# Patient Record
Sex: Male | Born: 1945 | Race: Black or African American | Hispanic: No | Marital: Married | Smoking: Current every day smoker
Health system: Southern US, Community
[De-identification: ages and names within clinical notes are randomized; demographics above are authoritative.]

## PROBLEM LIST (undated history)

## (undated) DIAGNOSIS — M810 Age-related osteoporosis without current pathological fracture: Secondary | ICD-10-CM

## (undated) DIAGNOSIS — M549 Dorsalgia, unspecified: Secondary | ICD-10-CM

## (undated) DIAGNOSIS — I10 Essential (primary) hypertension: Secondary | ICD-10-CM

## (undated) DIAGNOSIS — C9000 Multiple myeloma not having achieved remission: Secondary | ICD-10-CM

## (undated) DIAGNOSIS — D472 Monoclonal gammopathy: Secondary | ICD-10-CM

## (undated) DIAGNOSIS — E119 Type 2 diabetes mellitus without complications: Secondary | ICD-10-CM

---

## 2004-12-05 ENCOUNTER — Ambulatory Visit: Payer: Self-pay | Admitting: Gastroenterology

## 2005-01-04 ENCOUNTER — Ambulatory Visit: Payer: Self-pay | Admitting: Internal Medicine

## 2005-01-11 ENCOUNTER — Ambulatory Visit: Payer: Self-pay | Admitting: Internal Medicine

## 2005-02-22 ENCOUNTER — Ambulatory Visit: Payer: Self-pay | Admitting: Internal Medicine

## 2005-03-13 ENCOUNTER — Ambulatory Visit: Payer: Self-pay | Admitting: Internal Medicine

## 2005-04-13 ENCOUNTER — Ambulatory Visit: Payer: Self-pay | Admitting: Internal Medicine

## 2005-05-23 ENCOUNTER — Ambulatory Visit: Payer: Self-pay | Admitting: Internal Medicine

## 2005-06-13 ENCOUNTER — Ambulatory Visit: Payer: Self-pay | Admitting: Internal Medicine

## 2005-08-22 ENCOUNTER — Ambulatory Visit: Payer: Self-pay | Admitting: Internal Medicine

## 2005-09-13 ENCOUNTER — Ambulatory Visit: Payer: Self-pay | Admitting: Internal Medicine

## 2014-10-04 ENCOUNTER — Emergency Department: Payer: Self-pay | Admitting: Emergency Medicine

## 2016-05-21 IMAGING — CR DG THORACIC SPINE 2-3V
1 series · 3 of 3 positions shown · non-contrast
Comparison: None.

CLINICAL DATA: Fall with upper back pain and rib pain.

EXAM:
THORACIC SPINE - 2 VIEW

[Series 1: dxr thoracic  ap and lateral · 0.14mm/px · 3 of 3 slices shown]
[im 1/3]
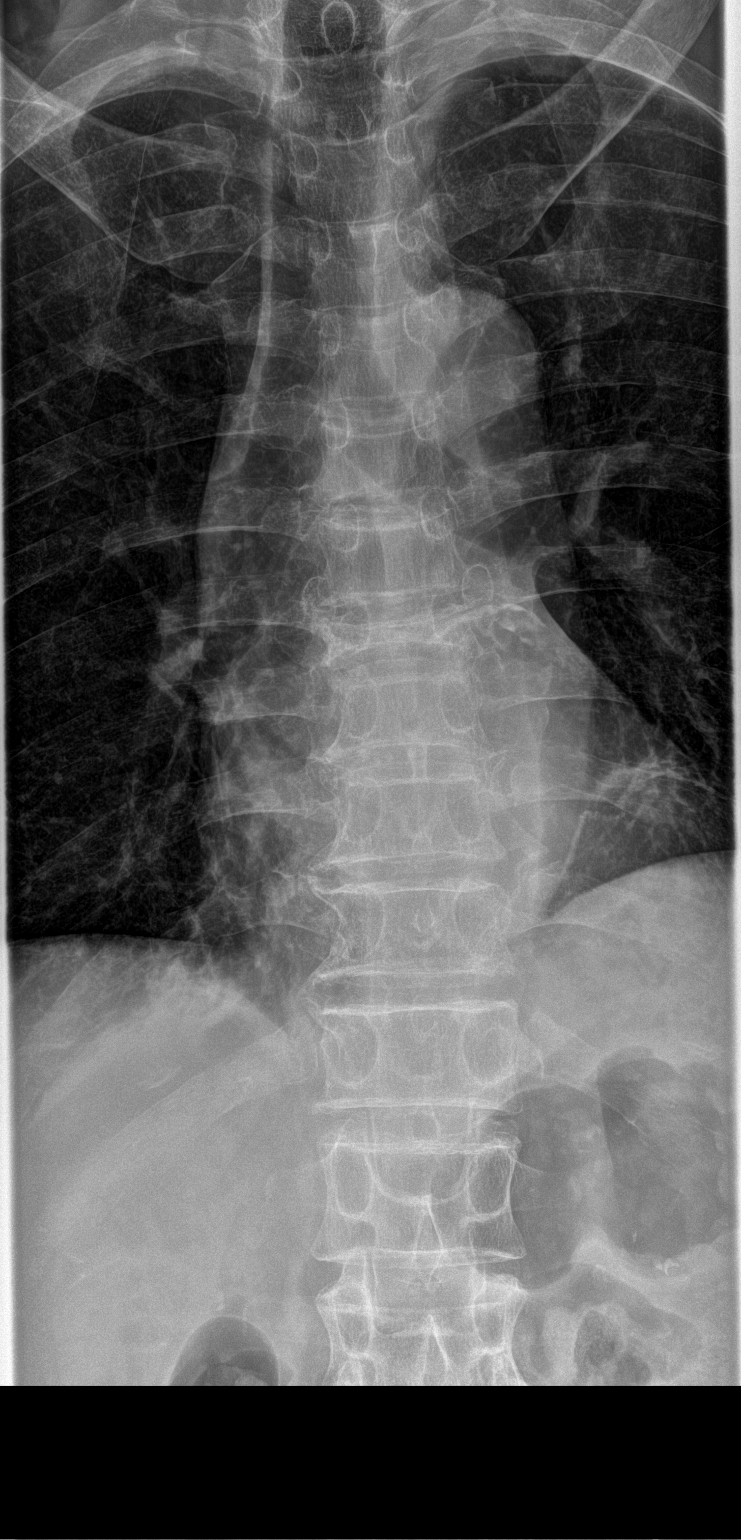
[im 2/3]
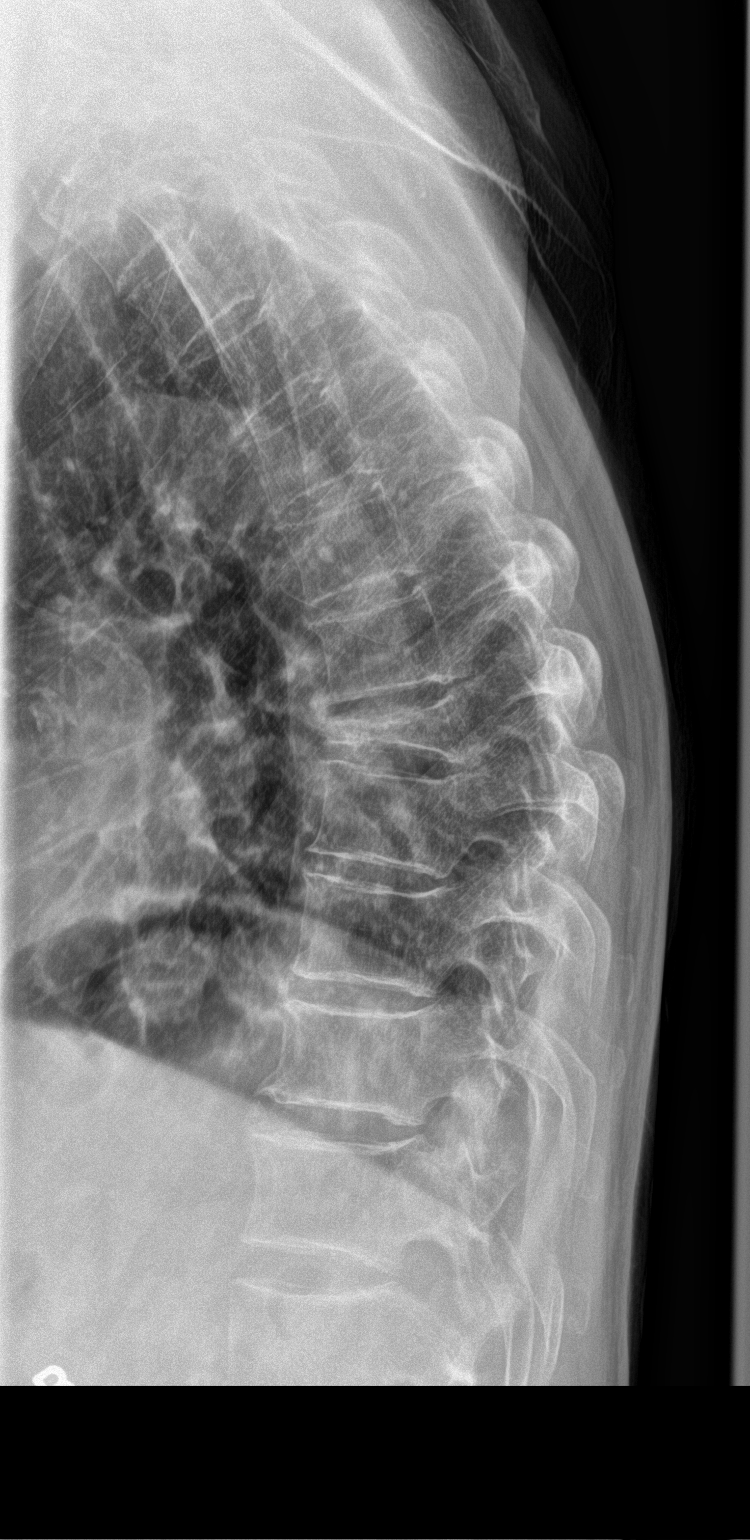
[im 3/3]
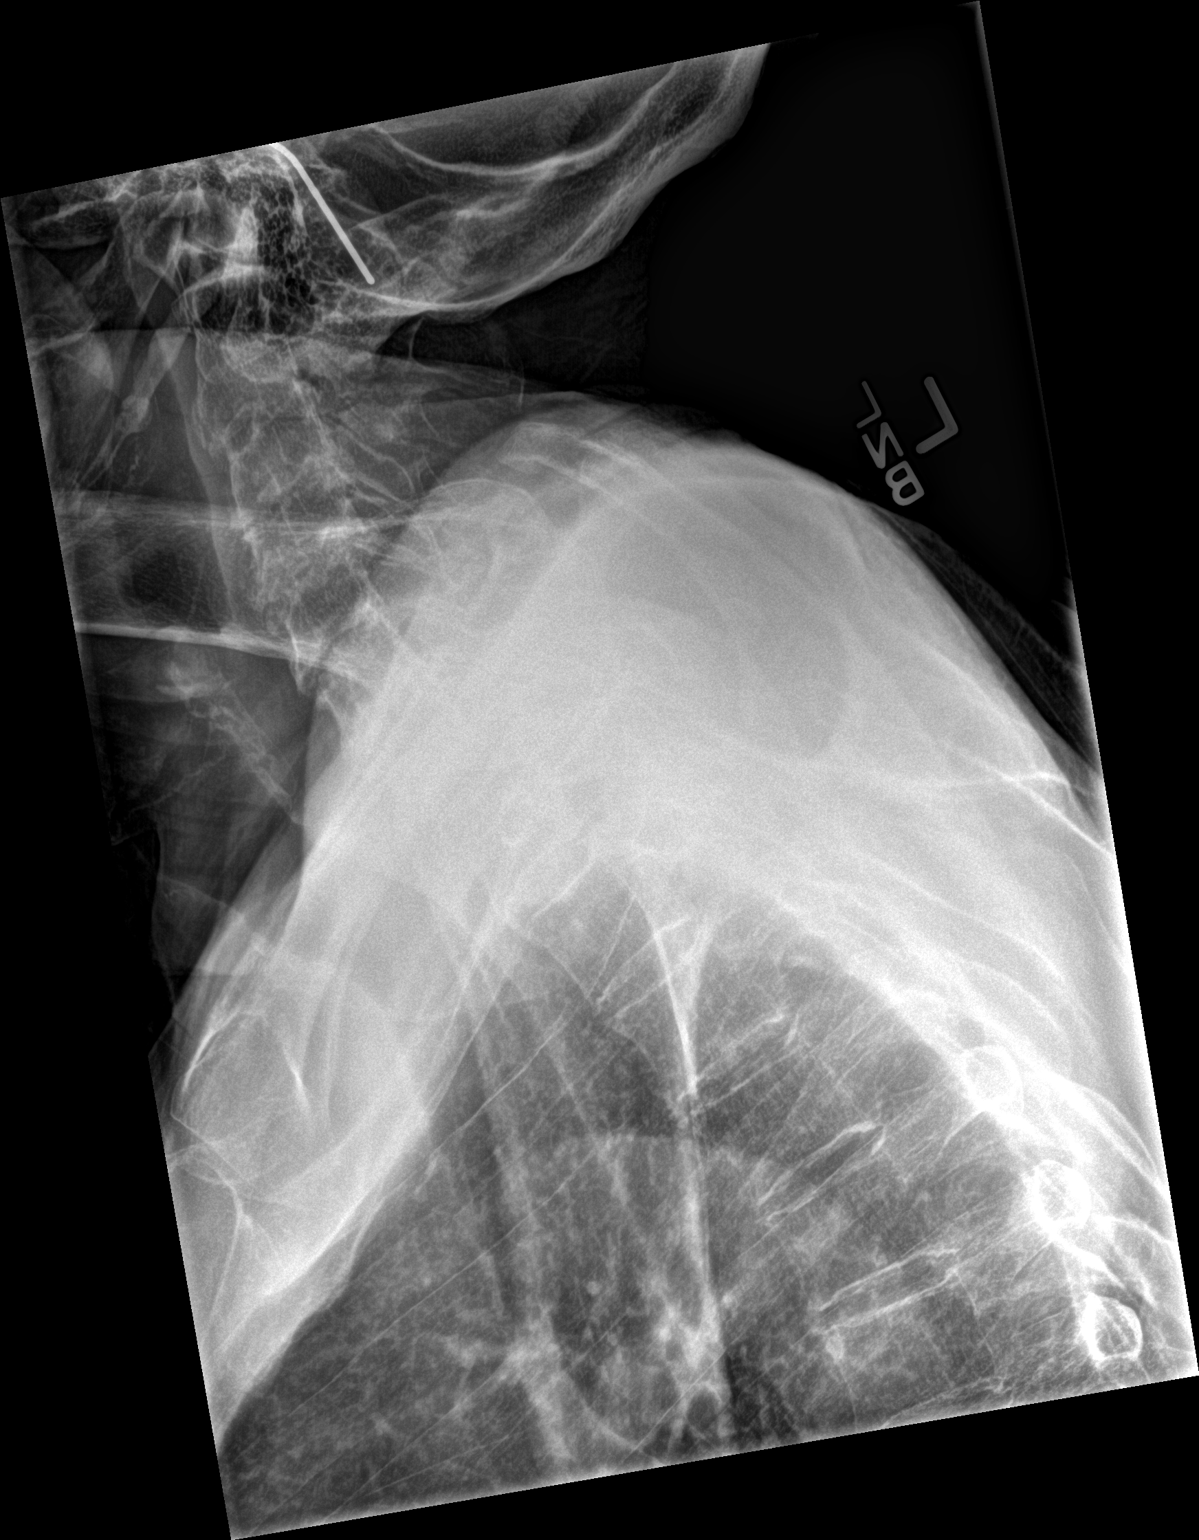

[3 of 3 positions shown; findings below may reference images not displayed]

FINDINGS: Severe compression fracture noted in the mid thoracic spine, likely
T8. This is age indeterminate, favor chronic. Normal alignment. No
additional fracture.
IMPRESSION: Severe T8 compression fracture, age indeterminate but favor chronic.

## 2016-05-29 ENCOUNTER — Encounter: Payer: Self-pay | Admitting: Emergency Medicine

## 2016-05-29 ENCOUNTER — Emergency Department
Admission: EM | Admit: 2016-05-29 | Discharge: 2016-05-29 | Disposition: A | Discharge status: Home / Self Care | Payer: PPO | Attending: Emergency Medicine | Admitting: Emergency Medicine

## 2016-05-29 DIAGNOSIS — I129 Hypertensive chronic kidney disease with stage 1 through stage 4 chronic kidney disease, or unspecified chronic kidney disease: Secondary | ICD-10-CM | POA: Insufficient documentation

## 2016-05-29 DIAGNOSIS — F10920 Alcohol use, unspecified with intoxication, uncomplicated: Secondary | ICD-10-CM

## 2016-05-29 DIAGNOSIS — M810 Age-related osteoporosis without current pathological fracture: Secondary | ICD-10-CM | POA: Insufficient documentation

## 2016-05-29 DIAGNOSIS — F17210 Nicotine dependence, cigarettes, uncomplicated: Secondary | ICD-10-CM | POA: Insufficient documentation

## 2016-05-29 DIAGNOSIS — E1122 Type 2 diabetes mellitus with diabetic chronic kidney disease: Secondary | ICD-10-CM | POA: Diagnosis not present

## 2016-05-29 DIAGNOSIS — N189 Chronic kidney disease, unspecified: Secondary | ICD-10-CM | POA: Diagnosis not present

## 2016-05-29 DIAGNOSIS — F10129 Alcohol abuse with intoxication, unspecified: Secondary | ICD-10-CM | POA: Diagnosis present

## 2016-05-29 HISTORY — DX: Monoclonal gammopathy: D47.2

## 2016-05-29 HISTORY — DX: Type 2 diabetes mellitus without complications: E11.9

## 2016-05-29 HISTORY — DX: Multiple myeloma not having achieved remission: C90.00

## 2016-05-29 HISTORY — DX: Essential (primary) hypertension: I10

## 2016-05-29 HISTORY — DX: Age-related osteoporosis without current pathological fracture: M81.0

## 2016-05-29 HISTORY — DX: Dorsalgia, unspecified: M54.9

## 2016-05-29 LAB — CBC WITH DIFFERENTIAL/PLATELET
BASOS PCT: 2 %
Basophils Absolute: 0.2 10*3/uL — ABNORMAL HIGH (ref 0–0.1)
EOS ABS: 0 10*3/uL (ref 0–0.7)
EOS PCT: 0 %
HCT: 29.4 % — ABNORMAL LOW (ref 40.0–52.0)
HEMOGLOBIN: 10.2 g/dL — AB (ref 13.0–18.0)
LYMPHS ABS: 2 10*3/uL (ref 1.0–3.6)
Lymphocytes Relative: 21 %
MCH: 35.4 pg — AB (ref 26.0–34.0)
MCHC: 34.6 g/dL (ref 32.0–36.0)
MCV: 102.2 fL — ABNORMAL HIGH (ref 80.0–100.0)
MONO ABS: 0.8 10*3/uL (ref 0.2–1.0)
MONOS PCT: 9 %
NEUTROS PCT: 68 %
Neutro Abs: 6.3 10*3/uL (ref 1.4–6.5)
PLATELETS: 211 10*3/uL (ref 150–440)
RBC: 2.87 MIL/uL — ABNORMAL LOW (ref 4.40–5.90)
RDW: 13.8 % (ref 11.5–14.5)
WBC: 9.3 10*3/uL (ref 3.8–10.6)

## 2016-05-29 LAB — COMPREHENSIVE METABOLIC PANEL
ALBUMIN: 2.9 g/dL — AB (ref 3.5–5.0)
ALT: 13 U/L — ABNORMAL LOW (ref 17–63)
ANION GAP: 15 (ref 5–15)
AST: 24 U/L (ref 15–41)
Alkaline Phosphatase: 103 U/L (ref 38–126)
BUN: 21 mg/dL — ABNORMAL HIGH (ref 6–20)
CHLORIDE: 96 mmol/L — AB (ref 101–111)
CO2: 26 mmol/L (ref 22–32)
Calcium: 7.5 mg/dL — ABNORMAL LOW (ref 8.9–10.3)
Creatinine, Ser: 1.81 mg/dL — ABNORMAL HIGH (ref 0.61–1.24)
GFR calc non Af Amer: 36 mL/min — ABNORMAL LOW (ref >60)
GFR, EST AFRICAN AMERICAN: 42 mL/min — AB (ref >60)
GLUCOSE: 116 mg/dL — AB (ref 65–99)
POTASSIUM: 3.9 mmol/L (ref 3.5–5.1)
SODIUM: 137 mmol/L (ref 135–145)
Total Bilirubin: 0.5 mg/dL (ref 0.3–1.2)
Total Protein: 6.3 g/dL — ABNORMAL LOW (ref 6.5–8.1)

## 2016-05-29 LAB — ETHANOL: Alcohol, Ethyl (B): 23 mg/dL — ABNORMAL HIGH (ref <5)

## 2016-05-29 NOTE — ED Notes (Signed)
Per report wife of patient called when she got home from shopping because she noted patient had slightly slurred speech. Was not aware patient had been consuming ETOH. Patient states he drank approx 1/2 of a fifth of gin while she was gone. Speech coherent. Smile symmetrical. Grips equal. 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 Denies pain,

## 2016-05-29 NOTE — ED Notes (Signed)
Called lab to see if they could add on Ethanol. They stated they could.

## 2016-05-29 NOTE — ED Notes (Signed)
Dr. Lord at bedside.  

## 2016-05-29 NOTE — Discharge Instructions (Signed)
You were evaluated for slurred speech initially, but found to have alcohol intoxication.  Your exam and evaluation are reassuring today in the emergency department.  As we discussed, do not start your chemotherapy medication tonight, call the oncologist tomorrow and discuss taking that medication when he may be drinking alcohol. Next line next line return to the emergency room for any new or worsening condition, especially any weakness or numbness, confusion or altered mental status, facial drooping, slurred speech, or any other symptoms concerning to you.   Alcohol Intoxication Alcohol intoxication occurs when you drink enough alcohol that it affects your ability to function. It can be mild or very severe. Drinking a lot of alcohol in a short time is called binge drinking. This can be very harmful. Drinking alcohol can also be more dangerous if you are taking medicines or other drugs. Some of the effects caused by alcohol may include:  Loss of coordination.  Changes in mood and behavior.  Unclear thinking.  Trouble talking (slurred speech).  Throwing up (vomiting).  Confusion.  Slowed breathing.  Twitching and shaking (seizures).  Loss of consciousness. HOME CARE  Do not drive after drinking alcohol.  Drink enough water and fluids to keep your pee (urine) clear or pale yellow. Avoid caffeine.  Only take medicine as told by your doctor. GET HELP IF:  You throw up (vomit) many times.  You do not feel better after a few days.  You frequently have alcohol intoxication. Your doctor can help decide if you should see a substance use treatment counselor. GET HELP RIGHT AWAY IF:  You become shaky when you stop drinking.  You have twitching and shaking.  You throw up blood. It may look bright red or like coffee grounds.  You notice blood in your poop (bowel movements).  You become lightheaded or pass out (faint). MAKE SURE YOU:   Understand these instructions.  Will  watch your condition.  Will get help right away if you are not doing well or get worse.   This information is not intended to replace advice given to you by your health care provider. Make sure you discuss any questions you have with your health care provider.   Document Released: 04/17/2008 Document Revised: 07/02/2013 Document Reviewed: 04/04/2013 Elsevier Interactive Patient Education [employer].

## 2016-05-29 NOTE — ED Provider Notes (Signed)
[facility] Emergency Department Provider Note   ____________________________________________  Time seen:  I have reviewed the triage vital signs and the triage nursing note.  HISTORY  Chief Complaint Alcohol Intoxication   Historian Patient and his wife and daughter  HPI Tom Jacobson is a 70 y.o. male with a history of alcohol abuse, also multiple myeloma, is here because his wife returned home from shopping and found him to be acting a little dazed and not answering her very well and possibly slurring his speech. She did not notice any facial droop. When EMS arrived there they smelled alcohol on his breath and the patient did indicate that he had drank a half of a fifth of gin.  Wife states that she knew that he drink beer, but that he had stopped the liquor.  Now patient is seems to be essentially back to baseline, and wife indicates that she thinks his symptoms were consistent with intoxication.  He is supposed to start chemotherapy pill tonight, his oncologist is at [facility].    Past Medical History  Diagnosis Date  . Back pain   . Hypertension   . Monoclonal paraproteinemia   . Osteoporosis   . Diabetes mellitus without complication (HCC)   . Multiple myeloma (HCC)     There are no active problems to display for this patient.   No past surgical history on file.  No current outpatient prescriptions on file.  Allergies Review of patient's allergies indicates no known allergies.  No family history on file.  Social History Social History  Substance Use Topics  . Smoking status: Current Every Day Smoker -- 1.00 packs/day    Types: Cigarettes  . Smokeless tobacco: None  . Alcohol Use: Yes    Review of Systems  Constitutional: Negative for fever. Eyes: Negative for visual changes. ENT: Negative for sore throat. Cardiovascular: Negative for chest pain. Respiratory: Negative for shortness of breath. Gastrointestinal: Negative  for abdominal pain, vomiting and diarrhea. Genitourinary: Negative for dysuria. Musculoskeletal: Negative for back pain. Skin: Negative for rash. Neurological: Negative for headache. 10 point Review of Systems otherwise negative ____________________________________________   PHYSICAL EXAM:  VITAL SIGNS: ED Triage Vitals  Enc Vitals Group     BP 05/29/16 1833 182/101 mmHg     Pulse Rate 05/29/16 1833 102     Resp 05/29/16 1833 18     Temp 05/29/16 1833 98.3 F (36.8 C)     Temp Source 05/29/16 1833 Oral     SpO2 05/29/16 1833 100 %     Weight 05/29/16 1833 150 lb (68.04 kg)     Height 05/29/16 1833 6' (1.829 m)     Head Cir --      Peak Flow --      Pain Score --      Pain Loc --      Pain Edu? --      Excl. in GC? --      Constitutional: Alert and oriented And cooperative. Well appearing and in no distress. HEENT   Head: Normocephalic and atraumatic.      Eyes: Conjunctivae are normal. PERRL. Normal extraocular movements.      Ears:         Nose: No congestion/rhinnorhea.   Mouth/Throat: Mucous membranes are moist.   Neck: No stridor. Cardiovascular/Chest: Normal rate, regular rhythm.  No murmurs, rubs, or gallops. Respiratory: Normal respiratory effort without tachypnea nor retractions. Breath sounds are clear and equal bilaterally. No wheezes/rales/rhonchi. Gastrointestinal: Soft. No  distention, no guarding, no rebound. Nontender.    Genitourinary/rectal:Deferred Musculoskeletal: Nontender with normal range of motion in all extremities. No joint effusions.  No lower extremity tenderness.  No edema. Neurologic:  No facial droop. Normal speech and language. No gross or focal neurologic deficits are appreciated. Skin:  Skin is warm, dry and intact. No rash noted. Psychiatric: Mood and affect are normal. Speech and behavior are normal. Patient exhibits appropriate insight and judgment.  ____________________________________________   EKG I, Lisa Roca, MD,  the attending physician have personally viewed and interpreted all ECGs.  None ____________________________________________  LABS (pertinent positives/negatives)  Labs Reviewed  CBC WITH DIFFERENTIAL/PLATELET - Abnormal; Notable for the following:    RBC 2.87 (*)    Hemoglobin 10.2 (*)    HCT 29.4 (*)    MCV 102.2 (*)    MCH 35.4 (*)    Basophils Absolute 0.2 (*)    All other components within normal limits  COMPREHENSIVE METABOLIC PANEL - Abnormal; Notable for the following:    Chloride 96 (*)    Glucose, Bld 116 (*)    BUN 21 (*)    Creatinine, Ser 1.81 (*)    Calcium 7.5 (*)    Total Protein 6.3 (*)    Albumin 2.9 (*)    ALT 13 (*)    GFR calc non Af Amer 36 (*)    GFR calc Af Amer 42 (*)    All other components within normal limits  ETHANOL - Abnormal; Notable for the following:    Alcohol, Ethyl (B) 23 (*)    All other components within normal limits    ____________________________________________  RADIOLOGY All Xrays were viewed by me. Imaging interpreted by Radiologist.  None __________________________________________  PROCEDURES  Procedure(s) performed: None  Critical Care performed: None  ____________________________________________   ED COURSE / ASSESSMENT AND PLAN  Pertinent labs & imaging results that were available during my care of the patient were reviewed by me and considered in my medical decision making (see chart for details).   EMS was called initially by the wife out of concern for possible stroke given the slurred speech. However it was uncovered by EMS to the patient had been drinking. Wife did go ahead and bring the patient to the ED for further evaluation, but she feels like his symptoms are consistent with alcohol intoxication, and she is not worried about stroke at this point time.  He does not have any focal neurologic findings on exam to make me concerned about diagnosis other than alcohol intoxication. He does not have any  slurred speech now.  They asked about whether or not they should take the new chemotherapy therapy medication and asked them to hold off tonight and discussed this with the oncologist tomorrow.  Patient does not want detox.   CONSULTATIONS:   None   Patient / Family / Caregiver informed of clinical course, medical decision-making process, and agree with plan.   I discussed return precautions, follow-up instructions, and discharged instructions with patient and/or family.   ___________________________________________   FINAL CLINICAL IMPRESSION(S) / ED DIAGNOSES   Final diagnoses:  Chronic renal failure, unspecified stage  Alcohol intoxication, uncomplicated ([place])              Note: This dictation was prepared with Dragon dictation. Any transcriptional errors that result from this process are unintentional   Lisa Roca, MD 05/29/16 2010

## 2016-10-13 DEATH — deceased
